# Patient Record
Sex: Female | Born: 1945 | Race: White | Hispanic: No | Smoking: Never smoker
Health system: Southern US, Community
[De-identification: ages and names within clinical notes are randomized; demographics above are authoritative.]

---

## 1997-11-02 HISTORY — PX: APPENDECTOMY: SHX54

## 2006-05-12 ENCOUNTER — Ambulatory Visit: Payer: Self-pay | Admitting: Family Medicine

## 2007-10-13 ENCOUNTER — Ambulatory Visit: Payer: Self-pay | Admitting: Family Medicine

## 2009-05-15 ENCOUNTER — Ambulatory Visit: Payer: Self-pay | Admitting: Family Medicine

## 2010-07-16 ENCOUNTER — Ambulatory Visit: Payer: Self-pay | Admitting: Family Medicine

## 2011-08-04 ENCOUNTER — Ambulatory Visit: Payer: Self-pay | Admitting: Family Medicine

## 2014-12-20 ENCOUNTER — Ambulatory Visit: Payer: Self-pay | Admitting: Family Medicine

## 2016-02-05 ENCOUNTER — Other Ambulatory Visit: Payer: Self-pay | Admitting: Family Medicine

## 2016-02-05 DIAGNOSIS — Z1231 Encounter for screening mammogram for malignant neoplasm of breast: Secondary | ICD-10-CM

## 2016-02-20 ENCOUNTER — Ambulatory Visit
Admission: RE | Admit: 2016-02-20 | Discharge: 2016-02-20 | Disposition: A | Discharge status: Home / Self Care | Payer: Medicare Other | Source: Ambulatory Visit | Attending: Family Medicine | Admitting: Family Medicine

## 2016-02-20 ENCOUNTER — Other Ambulatory Visit: Payer: Self-pay | Admitting: Family Medicine

## 2016-02-20 DIAGNOSIS — Z1231 Encounter for screening mammogram for malignant neoplasm of breast: Secondary | ICD-10-CM | POA: Diagnosis not present

## 2016-12-23 ENCOUNTER — Other Ambulatory Visit: Payer: Medicare Other

## 2017-02-12 ENCOUNTER — Other Ambulatory Visit: Payer: Self-pay | Admitting: Family Medicine

## 2017-02-12 DIAGNOSIS — Z1231 Encounter for screening mammogram for malignant neoplasm of breast: Secondary | ICD-10-CM

## 2017-02-23 ENCOUNTER — Ambulatory Visit
Admission: RE | Admit: 2017-02-23 | Discharge: 2017-02-23 | Disposition: A | Discharge status: Home / Self Care | Payer: Medicare Other | Source: Ambulatory Visit | Attending: Family Medicine | Admitting: Family Medicine

## 2017-02-23 DIAGNOSIS — R928 Other abnormal and inconclusive findings on diagnostic imaging of breast: Secondary | ICD-10-CM | POA: Insufficient documentation

## 2017-02-23 DIAGNOSIS — Z1231 Encounter for screening mammogram for malignant neoplasm of breast: Secondary | ICD-10-CM | POA: Diagnosis present

## 2017-02-24 ENCOUNTER — Other Ambulatory Visit: Payer: Self-pay | Admitting: Family Medicine

## 2017-02-24 ENCOUNTER — Other Ambulatory Visit: Payer: Self-pay | Admitting: Family

## 2017-02-24 DIAGNOSIS — R928 Other abnormal and inconclusive findings on diagnostic imaging of breast: Secondary | ICD-10-CM

## 2017-02-24 DIAGNOSIS — N6489 Other specified disorders of breast: Secondary | ICD-10-CM

## 2017-03-05 ENCOUNTER — Ambulatory Visit
Admission: RE | Admit: 2017-03-05 | Discharge: 2017-03-05 | Disposition: A | Discharge status: Home / Self Care | Payer: Medicare Other | Source: Ambulatory Visit | Attending: Family Medicine | Admitting: Family Medicine

## 2017-03-05 DIAGNOSIS — R928 Other abnormal and inconclusive findings on diagnostic imaging of breast: Secondary | ICD-10-CM

## 2017-03-05 DIAGNOSIS — N6489 Other specified disorders of breast: Secondary | ICD-10-CM | POA: Diagnosis not present

## 2017-04-08 ENCOUNTER — Encounter: Payer: Self-pay | Admitting: *Deleted

## 2017-04-19 ENCOUNTER — Encounter: Payer: Self-pay | Admitting: General Surgery

## 2017-04-19 ENCOUNTER — Ambulatory Visit (INDEPENDENT_AMBULATORY_CARE_PROVIDER_SITE_OTHER): Payer: Medicare Other | Admitting: General Surgery

## 2017-04-19 VITALS — BP 118/74 | HR 70 | Resp 12 | Ht 66.0 in | Wt 129.0 lb

## 2017-04-19 DIAGNOSIS — Z1211 Encounter for screening for malignant neoplasm of colon: Secondary | ICD-10-CM | POA: Diagnosis not present

## 2017-04-19 MED ORDER — POLYETHYLENE GLYCOL 3350 17 GM/SCOOP PO POWD: ORAL | 0 refills | Status: AC

## 2017-04-19 NOTE — Progress Notes (Signed)
Patient ID: Darlene Goodman, female   DOB: 1946-03-03, 71 y.o.   MRN: [id number]  Chief Complaint  Patient presents with  . Colonoscopy    HPI Darlene Goodman is a 71 y.o. female here today for a evaluation of a screening colonoscopy. Patient states no GI problems at this time. Patient has bowel movements every 2 days. HPI  No past medical history on file.  Past Surgical History:  Procedure Laterality Date  . APPENDECTOMY  1999    Family History  Problem Relation Age of Onset  . Colon cancer Maternal Aunt     Social History Social History  Substance Use Topics  . Smoking status: Never Smoker  . Smokeless tobacco: Never Used  . Alcohol use Yes    No Known Allergies  Current Outpatient Prescriptions  Medication Sig Dispense Refill  . Tetrahydrozoline-Zn Sulfate (EYE DROPS A/C OP) Apply to eye.    . polyethylene glycol powder (GLYCOLAX/MIRALAX) powder 255 grams one bottle for colonoscopy prep 255 g 0   No current facility-administered medications for this visit.     Review of Systems Review of Systems  Constitutional: Negative.   Respiratory: Negative.   Cardiovascular: Negative.   Gastrointestinal: Negative.   Genitourinary: Negative.     Blood pressure 118/74, pulse 70, resp. rate 12, height 5\' 6"  (1.676 m), weight 129 lb (58.5 kg).  Physical Exam Physical Exam  Constitutional: She is oriented to person, place, and time. She appears well-developed and well-nourished.  Eyes: Conjunctivae are normal. No scleral icterus.  Neck: Neck supple.  Cardiovascular: Normal rate, regular rhythm and normal heart sounds.   Pulmonary/Chest: Effort normal and breath sounds normal.  Abdominal: Soft. Normal appearance and bowel sounds are normal. There is no tenderness.  Lymphadenopathy:    She has no cervical adenopathy.  Neurological: She is alert and oriented to person, place, and time.  Skin: Skin is warm and dry.  Psychiatric: She has a normal mood and  affect. Her behavior is normal.    Data Reviewed Notes reviewed   Assessment    Colonoscopy screening- first colonoscopy for patient, no significant medical problems, she is in good health Family history of colon cancer- maternal aunt Stable exam     Plan    Colonoscopy with possible biopsy/polypectomy prn: Information regarding the procedure, including its potential risks and complications (including but not limited to perforation of the bowel, which may require emergency surgery to repair, and bleeding) was verbally given to the patient. Educational information regarding lower intestinal endoscopy was given to the patient. Written instructions for how to complete the bowel prep using Miralax were provided. The importance of drinking ample fluids to avoid dehydration as a result of the prep emphasized. HPI, Physical Exam, Assessment and Plan have been scribed under the direction and in the presence of Kathreen Cosier, MD  Ples Specter, CMA     HPI, Physical Exam, Assessment and Plan have been scribed under the direction and in the presence of Kathreen Cosier, MD  Ples Specter, CMA  I have completed the exam and reviewed the above documentation for accuracy and completeness.  I agree with the above.  Museum/gallery conservator has been used and any errors in dictation or transcription are unintentional.  Seeplaputhur G. Evette Cristal, M.D., F.A.C.S.  Gerlene Burdock G 04/19/2017, 1:09 PM  Patient has been scheduled for a colonoscopy on 06-16-17 at [facility]. Miralax prescription has been sent in to the patient's pharmacy today. Colonoscopy instructions have been reviewed with the patient. This  patient is aware to call the office if they have further questions.   Nicholes MangoMichele J. Bailey, CMA

## 2017-04-19 NOTE — Patient Instructions (Signed)
Colonoscopy, Adult A colonoscopy is an exam to look at the entire large intestine. During the exam, a lubricated, bendable tube is inserted into the anus and then passed into the rectum, colon, and other parts of the large intestine. A colonoscopy is often done as a part of normal colorectal screening or in response to certain symptoms, such as anemia, persistent diarrhea, abdominal pain, and blood in the stool. The exam can help screen for and diagnose medical problems, including:  Tumors.  Polyps.  Inflammation.  Areas of bleeding.  Tell a health care provider about:  Any allergies you have.  All medicines you are taking, including vitamins, herbs, eye drops, creams, and over-the-counter medicines.  Any problems you or family members have had with anesthetic medicines.  Any blood disorders you have.  Any surgeries you have had.  Any medical conditions you have.  Any problems you have had passing stool. What are the risks? Generally, this is a safe procedure. However, problems may occur, including:  Bleeding.  A tear in the intestine.  A reaction to medicines given during the exam.  Infection (rare).  What happens before the procedure? Eating and drinking restrictions Follow instructions from your health care provider about eating and drinking, which may include:  A few days before the procedure - follow a low-fiber diet. Avoid nuts, seeds, dried fruit, raw fruits, and vegetables.  1-3 days before the procedure - follow a clear liquid diet. Drink only clear liquids, such as clear broth or bouillon, black coffee or tea, clear juice, clear soft drinks or sports drinks, gelatin dessert, and popsicles. Avoid any liquids that contain red or purple dye.  On the day of the procedure - do not eat or drink anything during the 2 hours before the procedure, or within the time period that your health care provider recommends.  Bowel prep If you were prescribed an oral bowel prep  to clean out your colon:  Take it as told by your health care provider. Starting the day before your procedure, you will need to drink a large amount of medicated liquid. The liquid will cause you to have multiple loose stools until your stool is almost clear or light green.  If your skin or anus gets irritated from diarrhea, you may use these to relieve the irritation: ? Medicated wipes, such as adult wet wipes with aloe and vitamin E. ? A skin soothing-product like petroleum jelly.  If you vomit while drinking the bowel prep, take a break for up to 60 minutes and then begin the bowel prep again. If vomiting continues and you cannot take the bowel prep without vomiting, call your health care provider.  General instructions  Ask your health care provider about changing or stopping your regular medicines. This is especially important if you are taking diabetes medicines or blood thinners.  Plan to have someone take you home from the hospital or clinic. What happens during the procedure?  An IV tube may be inserted into one of your veins.  You will be given medicine to help you relax (sedative).  To reduce your risk of infection: ? Your health care team will wash or sanitize their hands. ? Your anal area will be washed with soap.  You will be asked to lie on your side with your knees bent.  Your health care provider will lubricate a long, thin, flexible tube. The tube will have a camera and a light on the end.  The tube will be inserted into your   anus.  The tube will be gently eased through your rectum and colon.  Air will be delivered into your colon to keep it open. You may feel some pressure or cramping.  The camera will be used to take images during the procedure.  A small tissue sample may be removed from your body to be examined under a microscope (biopsy). If any potential problems are found, the tissue will be sent to a lab for testing.  If small polyps are found, your  health care provider may remove them and have them checked for cancer cells.  The tube that was inserted into your anus will be slowly removed. The procedure may vary among health care providers and hospitals. What happens after the procedure?  Your blood pressure, heart rate, breathing rate, and blood oxygen level will be monitored until the medicines you were given have worn off.  Do not drive for 24 hours after the exam.  You may have a small amount of blood in your stool.  You may pass gas and have mild abdominal cramping or bloating due to the air that was used to inflate your colon during the exam.  It is up to you to get the results of your procedure. Ask your health care provider, or the department performing the procedure, when your results will be ready. This information is not intended to replace advice given to you by your health care provider. Make sure you discuss any questions you have with your health care provider. Document Released: 10/16/2000 Document Revised: 08/19/2016 Document Reviewed: 12/31/2015 Elsevier Interactive Patient Education  2018 Elsevier Inc.  

## 2017-06-08 ENCOUNTER — Other Ambulatory Visit: Payer: Self-pay | Admitting: General Surgery

## 2017-06-08 DIAGNOSIS — Z1211 Encounter for screening for malignant neoplasm of colon: Secondary | ICD-10-CM

## 2017-06-15 ENCOUNTER — Encounter: Payer: Self-pay | Admitting: *Deleted

## 2017-06-16 ENCOUNTER — Ambulatory Visit: Payer: Medicare Other | Admitting: Anesthesiology

## 2017-06-16 ENCOUNTER — Encounter
Admission: RE | Disposition: A | Discharge status: Home / Self Care | Payer: Self-pay | Source: Ambulatory Visit | Attending: General Surgery

## 2017-06-16 ENCOUNTER — Ambulatory Visit
Admission: RE | Admit: 2017-06-16 | Discharge: 2017-06-16 | Disposition: A | Discharge status: Home / Self Care | Payer: Medicare Other | Source: Ambulatory Visit | Attending: General Surgery | Admitting: General Surgery

## 2017-06-16 ENCOUNTER — Encounter: Payer: Self-pay | Admitting: Anesthesiology

## 2017-06-16 DIAGNOSIS — Z800 Family history of malignant neoplasm of digestive organs: Secondary | ICD-10-CM | POA: Diagnosis not present

## 2017-06-16 DIAGNOSIS — Z79899 Other long term (current) drug therapy: Secondary | ICD-10-CM | POA: Insufficient documentation

## 2017-06-16 DIAGNOSIS — Z1211 Encounter for screening for malignant neoplasm of colon: Secondary | ICD-10-CM

## 2017-06-16 DIAGNOSIS — K621 Rectal polyp: Secondary | ICD-10-CM | POA: Insufficient documentation

## 2017-06-16 DIAGNOSIS — K648 Other hemorrhoids: Secondary | ICD-10-CM | POA: Insufficient documentation

## 2017-06-16 HISTORY — PX: COLONOSCOPY WITH PROPOFOL: SHX5780

## 2017-06-16 SURGERY — COLONOSCOPY WITH PROPOFOL
Anesthesia: General

## 2017-06-16 MED ORDER — MIDAZOLAM HCL 2 MG/2ML IJ SOLN
INTRAMUSCULAR | Status: DC | PRN
  Administered 2017-06-16: 2 mg via INTRAVENOUS

## 2017-06-16 MED ORDER — PROPOFOL 500 MG/50ML IV EMUL
INTRAVENOUS | Status: AC
  Filled 2017-06-16: qty 50

## 2017-06-16 MED ORDER — EPHEDRINE SULFATE 50 MG/ML IJ SOLN
INTRAMUSCULAR | Status: AC
  Filled 2017-06-16: qty 1

## 2017-06-16 MED ORDER — PHENYLEPHRINE HCL 10 MG/ML IJ SOLN
INTRAMUSCULAR | Status: AC
  Filled 2017-06-16: qty 1

## 2017-06-16 MED ORDER — PROPOFOL 500 MG/50ML IV EMUL
INTRAVENOUS | Status: DC | PRN
  Administered 2017-06-16: 100 ug/kg/min via INTRAVENOUS

## 2017-06-16 MED ORDER — SODIUM CHLORIDE 0.9 % IV SOLN
INTRAVENOUS | Status: DC
  Administered 2017-06-16: 1000 mL via INTRAVENOUS

## 2017-06-16 MED ORDER — EPHEDRINE SULFATE 50 MG/ML IJ SOLN
INTRAMUSCULAR | Status: DC | PRN
  Administered 2017-06-16 (×2): 10 mg via INTRAVENOUS

## 2017-06-16 MED ORDER — PHENYLEPHRINE HCL 10 MG/ML IJ SOLN
INTRAMUSCULAR | Status: DC | PRN
  Administered 2017-06-16: 50 ug via INTRAVENOUS

## 2017-06-16 MED ORDER — FENTANYL CITRATE (PF) 100 MCG/2ML IJ SOLN
INTRAMUSCULAR | Status: AC
  Filled 2017-06-16: qty 2

## 2017-06-16 MED ORDER — MIDAZOLAM HCL 2 MG/2ML IJ SOLN
INTRAMUSCULAR | Status: AC
  Filled 2017-06-16: qty 2

## 2017-06-16 MED ORDER — FENTANYL CITRATE (PF) 100 MCG/2ML IJ SOLN
INTRAMUSCULAR | Status: DC | PRN
  Administered 2017-06-16: 50 ug via INTRAVENOUS
  Administered 2017-06-16 (×2): 25 ug via INTRAVENOUS

## 2017-06-16 NOTE — Op Note (Signed)
[facility] Gastroenterology Patient Name: Darlene Goodman Procedure Date: 06/16/2017 10:14 AM MRN: [id number] Account #: [id number] Date of Birth: 12/06/1945 Admit Type: Outpatient Age: 71 [facility] ROOM 1 Gender: Female Note Status: Finalized Procedure:            Colonoscopy Indications:          Screening for colorectal malignant neoplasm Providers:            Meerab Maselli G. Evette CristalSankar, MD Referring MD:         Rhona LeavensJames F. Burnett ShengHedrick, MD (Referring MD) Medicines:            Total IV Anesthesia (TIVA) Complications:        No immediate complications. Procedure:            Pre-Anesthesia Assessment:                       - General anesthesia under the supervision of an                        anesthesiologist was determined to be medically                        necessary for this procedure based on review of the                        patient's medical history, medications, and prior                        anesthesia history.                       After obtaining informed consent, the colonoscope was                        passed under direct vision. Throughout the procedure,                        the patient's blood pressure, pulse, and oxygen                        saturations were monitored continuously. The                        Colonoscope was introduced through the anus and                        advanced to the the cecum, identified by the ileocecal                        valve. The colonoscopy was performed without                        difficulty. The patient tolerated the procedure well.                        The quality of the bowel preparation was good. Findings:      The perianal and digital rectal examinations were normal.      Two sessile polyps were found in the rectum (benign-appearing lesion).       The polyps were 2 mm in size. These polyps were removed with  a cold       biopsy forceps. Resection and retrieval were complete.      Internal  hemorrhoids were found. The hemorrhoids were small. Impression:           - Two benign appearing 2 mm polyps in the rectum,                        removed with a cold biopsy forceps. Resected and                        retrieved.                       - Internal hemorrhoids. Recommendation:       - Return to primary care physician as previously                        scheduled. Procedure Code(s):    --- Professional ---                       [phone], Colonoscopy, flexible; with biopsy, single or                        multiple Diagnosis Code(s):    --- Professional ---                       Z12.11, Encounter for screening for malignant neoplasm                        of colon                       K62.1, Rectal polyp                       K64.8, Other hemorrhoids CPT copyright 2016 American Medical Association. All rights reserved. The codes documented in this report are preliminary and upon coder review may  be revised to meet current compliance requirements. Kieth Brightly, MD 06/16/2017 12:04:13 PM This report has been signed electronically. Number of Addenda: 0 Note Initiated On: 06/16/2017 10:14 AM Scope Withdrawal Time: 0 hours 15 minutes 48 seconds  Total Procedure Duration: 0 hours 36 minutes 53 seconds       [facility]

## 2017-06-16 NOTE — Interval H&P Note (Signed)
History and Physical Interval Note:  06/16/2017 11:18 AM  Darlene Goodman  has presented today for surgery, with the diagnosis of SCREENING  The various methods of treatment have been discussed with the patient and family. After consideration of risks, benefits and other options for treatment, the patient has consented to  Procedure(s): COLONOSCOPY WITH PROPOFOL (N/A) as a surgical intervention .  The patient's history has been reviewed, patient examined, no change in status, stable for surgery.  I have reviewed the patient's chart and labs.  Questions were answered to the patient's satisfaction.     Dolores Ewing G

## 2017-06-16 NOTE — Transfer of Care (Signed)
Immediate Anesthesia Transfer of Care Note  Patient: Darlene Goodman  Procedure(s) Performed: Procedure(s): COLONOSCOPY WITH PROPOFOL (N/A)  Patient Location: PACU  Anesthesia Type:General  Level of Consciousness: awake and sedated  Airway & Oxygen Therapy: Patient Spontanous Breathing and Patient connected to nasal cannula oxygen  Post-op Assessment: Report given to RN and Post -op Vital signs reviewed and stable  Post vital signs: Reviewed and stable  Last Vitals:  Vitals:   06/16/17 1003  BP: 138/80  Resp: 16  Temp: 36.9 C  SpO2: 100%    Last Pain:  Vitals:   06/16/17 1003  TempSrc: Tympanic         Complications: No apparent anesthesia complications

## 2017-06-16 NOTE — Anesthesia Postprocedure Evaluation (Signed)
Anesthesia Post Note  Patient: Darlene Goodman  Procedure(s) Performed: Procedure(s) (LRB): COLONOSCOPY WITH PROPOFOL (N/A)  Patient location during evaluation: Endoscopy Anesthesia Type: General Level of consciousness: awake and alert Pain management: pain level controlled Vital Signs Assessment: post-procedure vital signs reviewed and stable Respiratory status: spontaneous breathing and respiratory function stable Cardiovascular status: stable Anesthetic complications: no     Last Vitals:  Vitals:   06/16/17 1003 06/16/17 1205  BP: 138/80 (!) 85/41  Pulse:  69  Resp: 16 16  Temp: 36.9 C (!) 35.8 C  SpO2: 100% 99%    Last Pain:  Vitals:   06/16/17 1205  TempSrc: Tympanic                 Eevee Borbon K

## 2017-06-16 NOTE — Anesthesia Procedure Notes (Signed)
Performed by: COOK-MARTIN, Kadir Azucena Pre-anesthesia Checklist: Patient identified, Emergency Drugs available, Suction available, Patient being monitored and Timeout performed Patient Re-evaluated:Patient Re-evaluated prior to induction Oxygen Delivery Method: Nasal cannula Preoxygenation: Pre-oxygenation with 100% oxygen Induction Type: IV induction Placement Confirmation: positive ETCO2 and CO2 detector       

## 2017-06-16 NOTE — Anesthesia Preprocedure Evaluation (Signed)
Anesthesia Evaluation  Patient identified by MRN, date of birth, ID band Patient awake    Reviewed: Allergy & Precautions, NPO status , Patient's Chart, lab work & pertinent test results  History of Anesthesia Complications Negative for: history of anesthetic complications  Airway Mallampati: II       Dental   Pulmonary neg sleep apnea, neg COPD,           Cardiovascular (-) hypertension(-) Past MI and (-) CHF (-) dysrhythmias (-) Valvular Problems/Murmurs     Neuro/Psych    GI/Hepatic Neg liver ROS, neg GERD  ,  Endo/Other  neg diabetes  Renal/GU negative Renal ROS     Musculoskeletal   Abdominal   Peds  Hematology   Anesthesia Other Findings   Reproductive/Obstetrics                             Anesthesia Physical Anesthesia Plan  ASA: I  Anesthesia Plan: General   Post-op Pain Management:    Induction:   PONV Risk Score and Plan:   Airway Management Planned:   Additional Equipment:   Intra-op Plan:   Post-operative Plan:   Informed Consent: I have reviewed the patients History and Physical, chart, labs and discussed the procedure including the risks, benefits and alternatives for the proposed anesthesia with the patient or authorized representative who has indicated his/her understanding and acceptance.     Plan Discussed with:   Anesthesia Plan Comments:         Anesthesia Quick Evaluation

## 2017-06-16 NOTE — H&P (Signed)
Darlene Goodman is an 71 y.o. female.   Chief Complaint: Here for colonoscopy HPI: 71 yr old female here for her first screening colonoscopy.  denies any prior or current GI issues. Moves her bowels usually every 2 days. No blood in sttol. No FH of colon CA or polyps  History reviewed. No pertinent past medical history.  Past Surgical History:  Procedure Laterality Date  . APPENDECTOMY  1999    Family History  Problem Relation Age of Onset  . Colon cancer Maternal Aunt    Social History:  reports that she has never smoked. She has never used smokeless tobacco. She reports that she drinks alcohol. She reports that she does not use drugs.  Allergies: No Known Allergies  Medications Prior to Admission  Medication Sig Dispense Refill  . polyethylene glycol powder (GLYCOLAX/MIRALAX) powder 255 grams one bottle for colonoscopy prep 255 g 0  . Tetrahydrozoline-Zn Sulfate (EYE DROPS A/C OP) Apply to eye.      No results found for this or any previous visit (from the past 48 hour(s)). No results found.  Review of Systems  Constitutional: Negative.   Respiratory: Negative.   Cardiovascular: Negative.   Gastrointestinal: Negative.   Genitourinary: Negative.     Blood pressure 138/80, temperature 98.4 F (36.9 C), temperature source Tympanic, resp. rate 16, height 5\' 6"  (1.676 m), weight 128 lb (58.1 kg), SpO2 100 %. Physical Exam  Constitutional: She is oriented to person, place, and time. She appears well-developed and well-nourished.  Eyes: Conjunctivae are normal. No scleral icterus.  Neck: Neck supple.  Cardiovascular: Normal rate, regular rhythm and normal heart sounds.   Respiratory: Effort normal and breath sounds normal.  GI: Soft. Bowel sounds are normal. She exhibits no distension and no mass. There is no tenderness.  Lymphadenopathy:    She has no cervical adenopathy.  Neurological: She is alert and oriented to person, place, and time.  Skin: Skin is warm and  dry.     Assessment/Plan Proceed with colonoscopy as planned.  Kieth BrightlySANKAR,Estephan Gallardo G, MD 06/16/2017, 11:15 AM

## 2017-06-16 NOTE — Anesthesia Post-op Follow-up Note (Signed)
Anesthesia QCDR form completed.        

## 2017-06-17 ENCOUNTER — Encounter: Payer: Self-pay | Admitting: General Surgery

## 2017-06-17 LAB — SURGICAL PATHOLOGY

## 2017-06-21 ENCOUNTER — Ambulatory Visit: Payer: Medicare Other | Admitting: Family Medicine

## 2018-02-15 ENCOUNTER — Other Ambulatory Visit: Payer: Self-pay | Admitting: Family Medicine

## 2018-02-15 DIAGNOSIS — Z1231 Encounter for screening mammogram for malignant neoplasm of breast: Secondary | ICD-10-CM

## 2018-03-04 ENCOUNTER — Ambulatory Visit
Admission: RE | Admit: 2018-03-04 | Discharge: 2018-03-04 | Disposition: A | Discharge status: Home / Self Care | Payer: Medicare Other | Source: Ambulatory Visit | Attending: Family Medicine | Admitting: Family Medicine

## 2018-03-04 DIAGNOSIS — Z1231 Encounter for screening mammogram for malignant neoplasm of breast: Secondary | ICD-10-CM | POA: Diagnosis not present

## 2019-02-20 ENCOUNTER — Other Ambulatory Visit: Payer: Self-pay | Admitting: Family Medicine

## 2019-02-20 DIAGNOSIS — Z1231 Encounter for screening mammogram for malignant neoplasm of breast: Secondary | ICD-10-CM

## 2019-04-19 ENCOUNTER — Ambulatory Visit
Admission: RE | Admit: 2019-04-19 | Discharge: 2019-04-19 | Disposition: A | Discharge status: Home / Self Care | Payer: Medicare Other | Source: Ambulatory Visit | Attending: Family Medicine | Admitting: Family Medicine

## 2019-04-19 ENCOUNTER — Other Ambulatory Visit: Payer: Self-pay

## 2019-04-19 DIAGNOSIS — Z1231 Encounter for screening mammogram for malignant neoplasm of breast: Secondary | ICD-10-CM | POA: Diagnosis not present

## 2021-05-15 ENCOUNTER — Other Ambulatory Visit: Payer: Self-pay | Admitting: Family Medicine

## 2021-05-15 DIAGNOSIS — Z1231 Encounter for screening mammogram for malignant neoplasm of breast: Secondary | ICD-10-CM

## 2021-05-21 ENCOUNTER — Ambulatory Visit
Admission: RE | Admit: 2021-05-21 | Discharge: 2021-05-21 | Disposition: A | Discharge status: Home / Self Care | Payer: Medicare Other | Source: Ambulatory Visit | Attending: Family Medicine | Admitting: Family Medicine

## 2021-05-21 ENCOUNTER — Other Ambulatory Visit: Payer: Self-pay

## 2021-05-21 DIAGNOSIS — Z1231 Encounter for screening mammogram for malignant neoplasm of breast: Secondary | ICD-10-CM | POA: Diagnosis present

## 2021-05-23 ENCOUNTER — Inpatient Hospital Stay
Admission: RE | Admit: 2021-05-23 | Discharge: 2021-05-23 | Disposition: A | Discharge status: Home / Self Care | Payer: Self-pay | Source: Ambulatory Visit | Attending: *Deleted | Admitting: *Deleted

## 2021-05-23 ENCOUNTER — Other Ambulatory Visit: Payer: Self-pay | Admitting: *Deleted

## 2021-05-23 DIAGNOSIS — Z1231 Encounter for screening mammogram for malignant neoplasm of breast: Secondary | ICD-10-CM

## 2021-07-28 IMAGING — MG MM DIGITAL SCREENING BILAT W/ TOMO AND CAD
6 of 10 series · 6 of 30 positions shown · non-contrast
Comparison: Previous exam(s).

CLINICAL DATA: Screening.

EXAM:
DIGITAL SCREENING BILATERAL MAMMOGRAM WITH TOMOSYNTHESIS AND CAD
TECHNIQUE: Bilateral screening digital craniocaudal and mediolateral oblique
mammograms were obtained. Bilateral screening digital breast
tomosynthesis was performed. The images were evaluated with
computer-aided detection.

[R MLO synth-2D (1 of 2)]
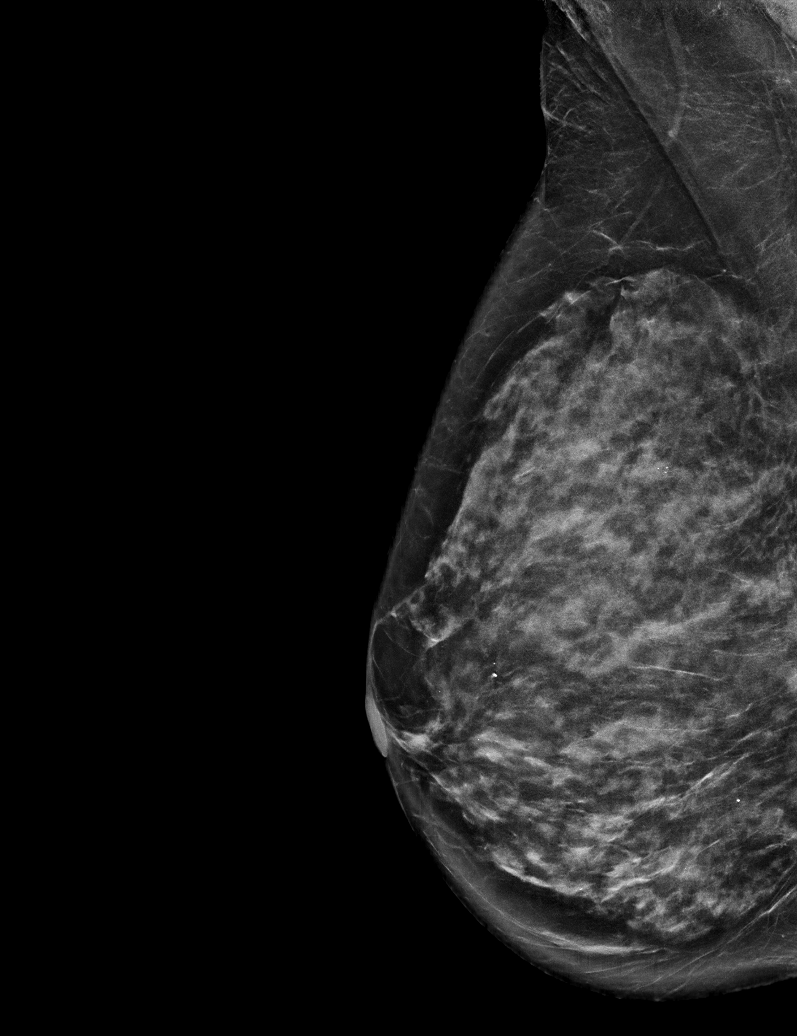

[R CC synth-2D]
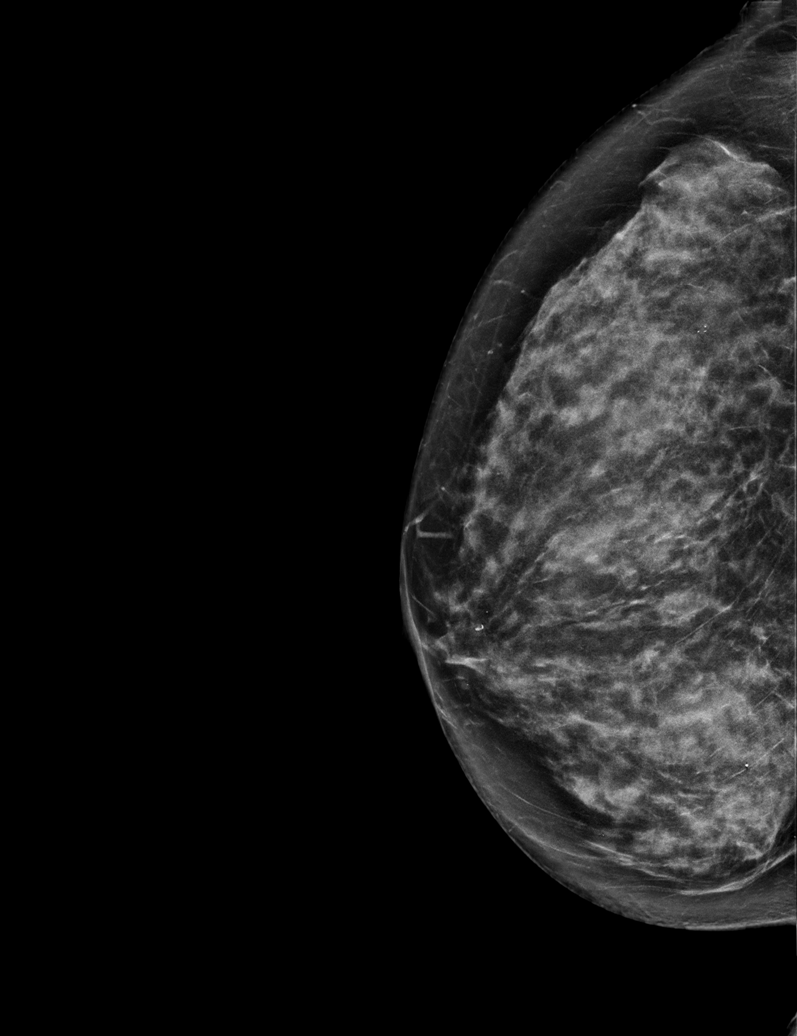

[L MLO synth-2D]
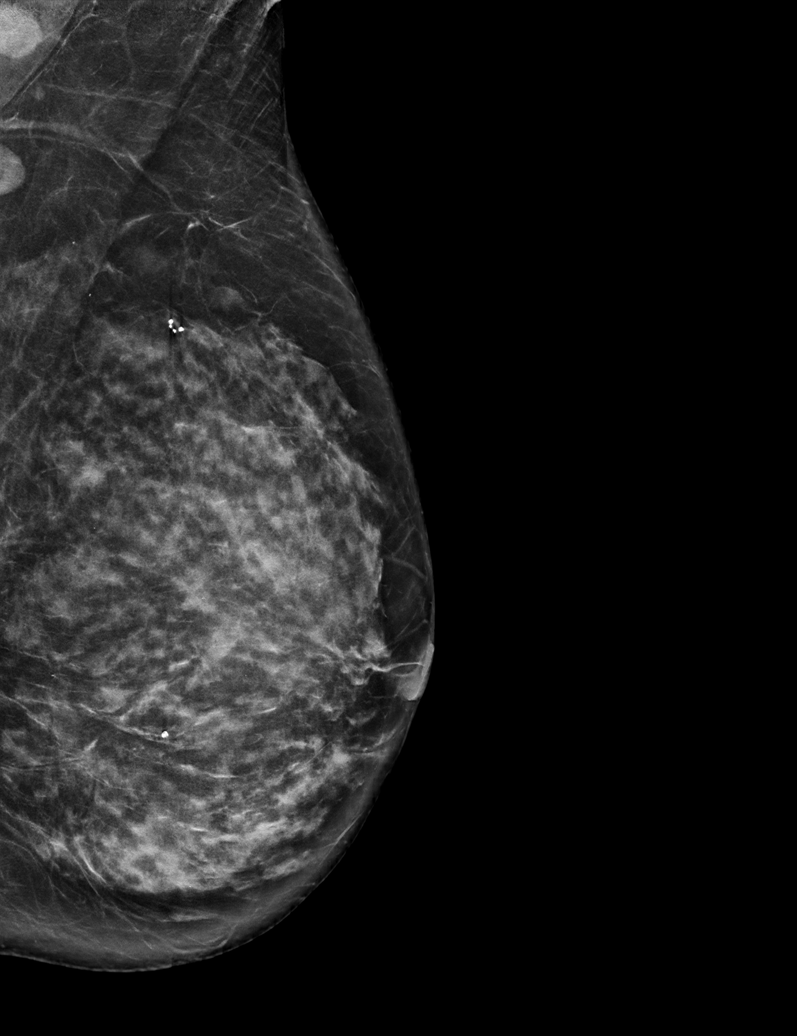

[L CC synth-2D]
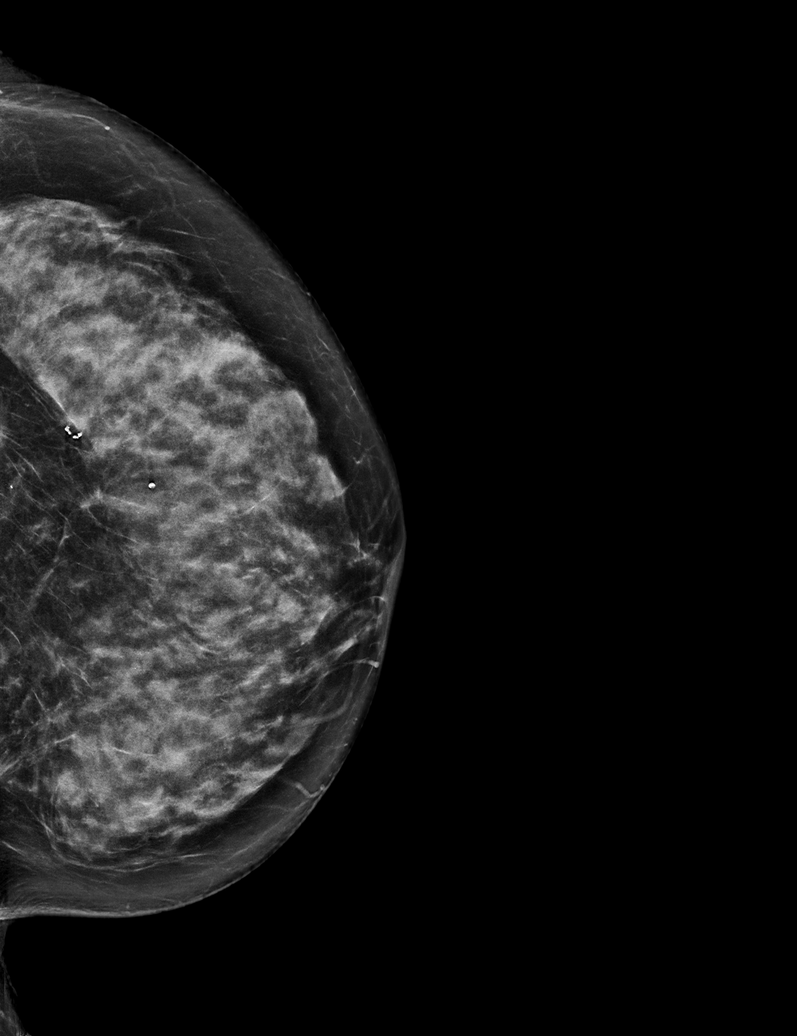

[R MLO synth-2D (2 of 2)]
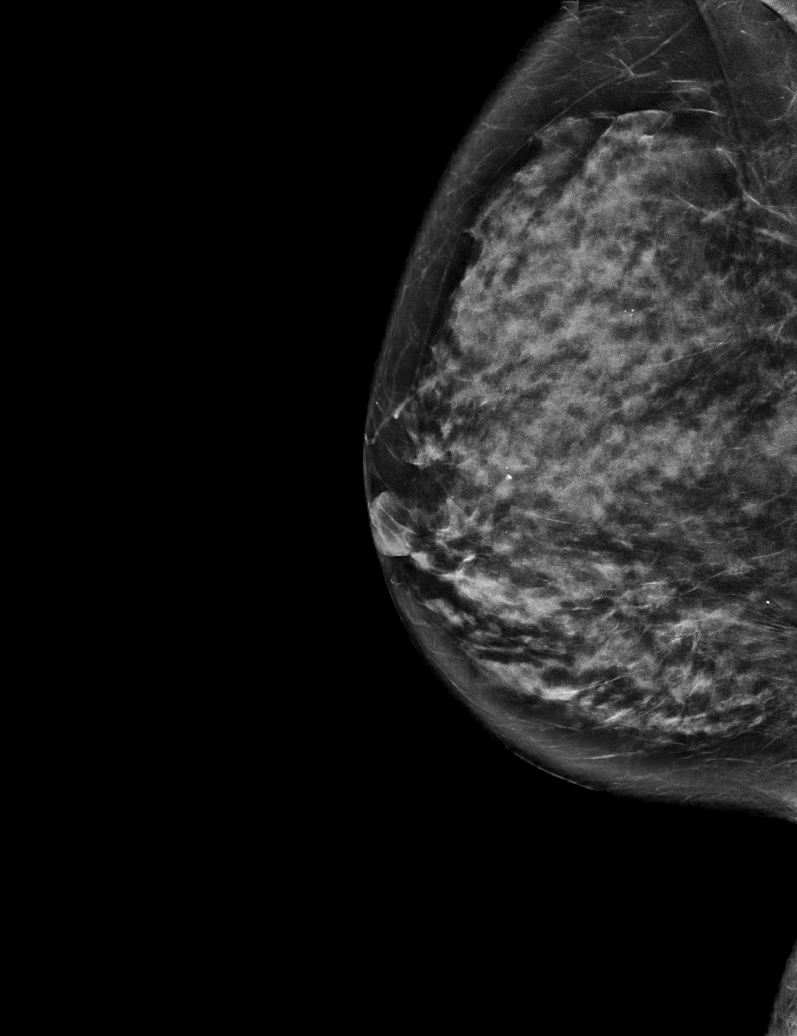

[L CC tomo · tomo slice 33/64.0]
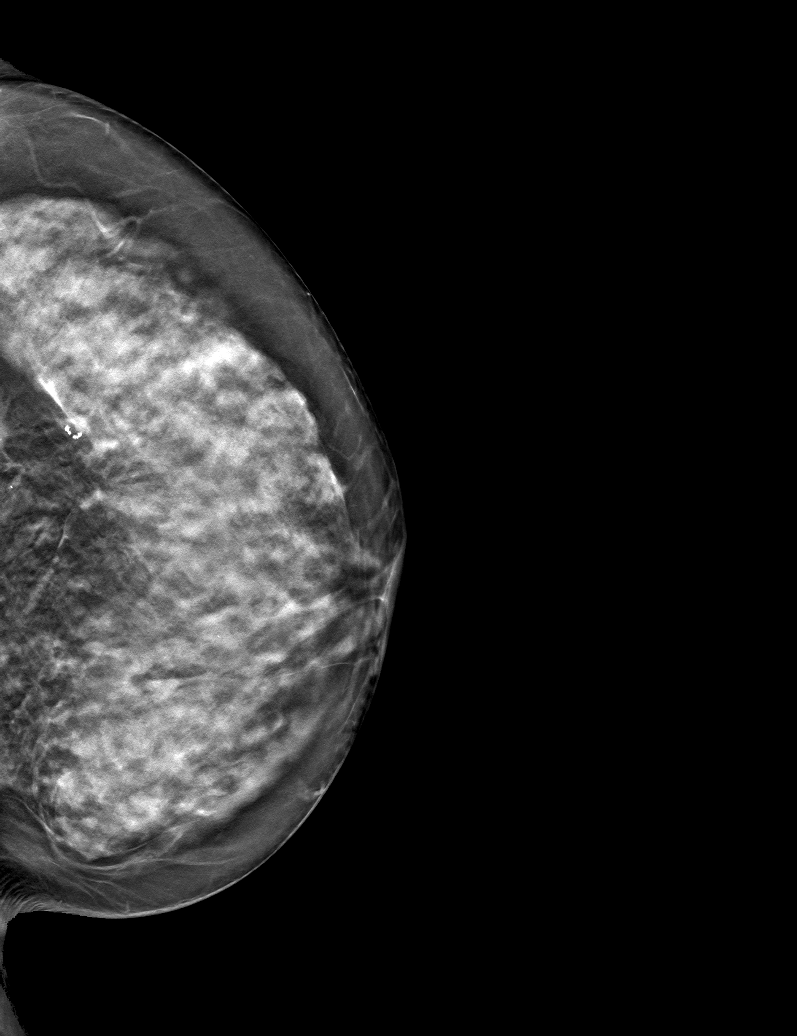

[6 of 30 positions shown; findings below may reference images not displayed]

ACR Breast Density Category d: The breast tissue is extremely dense,
which lowers the sensitivity of mammography
FINDINGS: There are no findings suspicious for malignancy.
IMPRESSION: No mammographic evidence of malignancy. A result letter of this
screening mammogram will be mailed directly to the patient.

RECOMMENDATION:
Screening mammogram in one year. (Code:TA-V-WV9)

BI-RADS CATEGORY  1: Negative.

## 2022-05-11 ENCOUNTER — Other Ambulatory Visit: Payer: Self-pay | Admitting: Family Medicine

## 2022-05-11 DIAGNOSIS — Z1231 Encounter for screening mammogram for malignant neoplasm of breast: Secondary | ICD-10-CM

## 2022-06-12 ENCOUNTER — Ambulatory Visit
Admission: RE | Admit: 2022-06-12 | Discharge: 2022-06-12 | Disposition: A | Discharge status: Home / Self Care | Payer: Medicare Other | Source: Ambulatory Visit | Attending: Family Medicine | Admitting: Family Medicine

## 2022-06-12 DIAGNOSIS — Z1231 Encounter for screening mammogram for malignant neoplasm of breast: Secondary | ICD-10-CM | POA: Diagnosis present

## 2022-06-18 ENCOUNTER — Other Ambulatory Visit: Payer: Self-pay | Admitting: Family Medicine

## 2022-06-18 DIAGNOSIS — R928 Other abnormal and inconclusive findings on diagnostic imaging of breast: Secondary | ICD-10-CM

## 2022-07-10 ENCOUNTER — Ambulatory Visit
Admission: RE | Admit: 2022-07-10 | Discharge: 2022-07-10 | Disposition: A | Discharge status: Home / Self Care | Payer: Medicare Other | Source: Ambulatory Visit | Attending: Family Medicine | Admitting: Family Medicine

## 2022-07-10 DIAGNOSIS — R928 Other abnormal and inconclusive findings on diagnostic imaging of breast: Secondary | ICD-10-CM | POA: Insufficient documentation

## 2022-07-15 ENCOUNTER — Other Ambulatory Visit: Payer: Self-pay | Admitting: Family Medicine

## 2022-07-15 DIAGNOSIS — R928 Other abnormal and inconclusive findings on diagnostic imaging of breast: Secondary | ICD-10-CM

## 2022-08-04 ENCOUNTER — Ambulatory Visit
Admission: RE | Admit: 2022-08-04 | Discharge: 2022-08-04 | Disposition: A | Discharge status: Home / Self Care | Payer: Medicare Other | Source: Ambulatory Visit | Attending: Family Medicine | Admitting: Family Medicine

## 2022-08-04 DIAGNOSIS — R928 Other abnormal and inconclusive findings on diagnostic imaging of breast: Secondary | ICD-10-CM | POA: Insufficient documentation

## 2022-08-04 HISTORY — PX: BREAST BIOPSY: SHX20

## 2022-08-05 LAB — SURGICAL PATHOLOGY
